# Patient Record
Sex: Female | Born: 1950 | Race: Black or African American | Hispanic: No | Smoking: Current some day smoker
Health system: Southern US, Community
[De-identification: ages and names within clinical notes are randomized; demographics above are authoritative.]

## PROBLEM LIST (undated history)

## (undated) DIAGNOSIS — R011 Cardiac murmur, unspecified: Secondary | ICD-10-CM

## (undated) DIAGNOSIS — I10 Essential (primary) hypertension: Secondary | ICD-10-CM

## (undated) DIAGNOSIS — R0600 Dyspnea, unspecified: Secondary | ICD-10-CM

## (undated) DIAGNOSIS — E119 Type 2 diabetes mellitus without complications: Secondary | ICD-10-CM

## (undated) DIAGNOSIS — E785 Hyperlipidemia, unspecified: Secondary | ICD-10-CM

## (undated) DIAGNOSIS — G629 Polyneuropathy, unspecified: Secondary | ICD-10-CM

## (undated) DIAGNOSIS — M1990 Unspecified osteoarthritis, unspecified site: Secondary | ICD-10-CM

## (undated) HISTORY — PX: BREAST SURGERY: SHX581

---

## 2011-11-21 ENCOUNTER — Other Ambulatory Visit (HOSPITAL_COMMUNITY): Payer: Self-pay | Admitting: Nurse Practitioner

## 2011-11-21 DIAGNOSIS — Z139 Encounter for screening, unspecified: Secondary | ICD-10-CM

## 2011-12-02 ENCOUNTER — Ambulatory Visit (HOSPITAL_COMMUNITY): Payer: Self-pay

## 2015-10-06 ENCOUNTER — Emergency Department
Admission: EM | Admit: 2015-10-06 | Discharge: 2015-10-06 | Disposition: A | Discharge status: Home / Self Care | Payer: Medicare HMO | Attending: Emergency Medicine | Admitting: Emergency Medicine

## 2015-10-06 ENCOUNTER — Encounter: Payer: Self-pay | Admitting: Emergency Medicine

## 2015-10-06 ENCOUNTER — Emergency Department: Payer: Medicare HMO

## 2015-10-06 DIAGNOSIS — Y9289 Other specified places as the place of occurrence of the external cause: Secondary | ICD-10-CM | POA: Insufficient documentation

## 2015-10-06 DIAGNOSIS — W01198A Fall on same level from slipping, tripping and stumbling with subsequent striking against other object, initial encounter: Secondary | ICD-10-CM | POA: Insufficient documentation

## 2015-10-06 DIAGNOSIS — E119 Type 2 diabetes mellitus without complications: Secondary | ICD-10-CM | POA: Diagnosis not present

## 2015-10-06 DIAGNOSIS — Y9301 Activity, walking, marching and hiking: Secondary | ICD-10-CM | POA: Insufficient documentation

## 2015-10-06 DIAGNOSIS — S8002XA Contusion of left knee, initial encounter: Secondary | ICD-10-CM | POA: Diagnosis not present

## 2015-10-06 DIAGNOSIS — I10 Essential (primary) hypertension: Secondary | ICD-10-CM | POA: Diagnosis not present

## 2015-10-06 DIAGNOSIS — Y999 Unspecified external cause status: Secondary | ICD-10-CM | POA: Diagnosis not present

## 2015-10-06 DIAGNOSIS — M1712 Unilateral primary osteoarthritis, left knee: Secondary | ICD-10-CM | POA: Diagnosis not present

## 2015-10-06 DIAGNOSIS — F17210 Nicotine dependence, cigarettes, uncomplicated: Secondary | ICD-10-CM | POA: Insufficient documentation

## 2015-10-06 DIAGNOSIS — S0181XA Laceration without foreign body of other part of head, initial encounter: Secondary | ICD-10-CM | POA: Insufficient documentation

## 2015-10-06 HISTORY — DX: Essential (primary) hypertension: I10

## 2015-10-06 HISTORY — DX: Type 2 diabetes mellitus without complications: E11.9

## 2015-10-06 NOTE — ED Provider Notes (Signed)
[facility] Emergency Department Provider Note   ____________________________________________   First MD Initiated Contact with Patient 10/06/15 [phone]     (approximate)  I have reviewed the triage vital signs and the nursing notes.   HISTORY  Chief Complaint Fall and Head Laceration    HPI Sarah Wiley is a 65 y.o. female patient complain of forehead pain and laceration secondary to a fall. Patient also complaining of left knee pain secondary to a fall. Patient states she was walking her left knee gave out causing her to land on her knee and didn't hit her head on the concrete. Patient denies any LOC. Incident occurred approximately one half hours prior to arrival. Patient has a history of bilateral knee pain which she stated unusual for the knee give out on her. Patient put initially days after fall with meals okay now. Patient denies any vertigo or vision disturbance. Patient rates his overall pain as a 3/10. Patient describes the pain as "achy". No palliative measures prior to arrival.  Past Medical History:  Diagnosis Date  . Diabetes mellitus without complication (HCC)   . Hypertension     There are no active problems to display for this patient.   History reviewed. No pertinent surgical history.  Prior to Admission medications   Not on File    Allergies Review of patient's allergies indicates no known allergies.  History reviewed. No pertinent family history.  Social History Social History  Substance Use Topics  . Smoking status: Current Every Day Smoker    Types: Cigarettes  . Smokeless tobacco: Never Used  . Alcohol use No    Review of Systems Constitutional: No fever/chills Eyes: No visual changes. ENT: No sore throat. Cardiovascular: Denies chest pain. Respiratory: Denies shortness of breath. Gastrointestinal: No abdominal pain.  No nausea, no vomiting.  No diarrhea.  No constipation. Genitourinary: Negative for  dysuria. Musculoskeletal: Left knee pain  Skin: Negative for rash. Forehead laceration Neurological: Negative for headaches, focal weakness or numbness. Endocrine:Diabetes /hypertension ____________________________________________   PHYSICAL EXAM:  VITAL SIGNS: ED Triage Vitals [10/06/15 2009]  Enc Vitals Group     BP (!) 129/57     Pulse Rate 68     Resp 18     Temp 98.4 F (36.9 C)     Temp Source Oral     SpO2 97 %     Weight 270 lb (122.5 kg)     Height 5\' 9"  (1.753 m)     Head Circumference      Peak Flow      Pain Score 0     Pain Loc      Pain Edu?      Excl. in GC?    Constitutional: Alert and oriented. Well appearing and in no acute distress. Eyes: Conjunctivae are normal. PERRL. EOMI. Head: Atraumatic. Nose: No congestion/rhinnorhea. Mouth/Throat: Mucous membranes are moist.  Oropharynx non-erythematous. Neck: No stridor.  No cervical spine tenderness to palpation. Hematological/Lymphatic/Immunilogical: No cervical lymphadenopathy. Cardiovascular: Normal rate, regular rhythm. Grossly normal heart sounds.  Good peripheral circulation. Respiratory: Normal respiratory effort.  No retractions. Lungs CTAB. Gastrointestinal: Soft and nontender. No distention. No abdominal bruits. No CVA tenderness. Musculoskeletal: No lower extremity tenderness nor edema.  No joint effusions. Neurologic:  Normal speech and language. No gross focal neurologic deficits are appreciated. No gait instability. Skin:  Skin is warm, dry and intact. No rash noted. 1.5 horizontal laceration to the forehead Psychiatric: Mood and affect are normal. Speech and behavior are  normal.  ____________________________________________   LABS (all labs ordered are listed, but only abnormal results are displayed)  Labs Reviewed - No data to  display ____________________________________________  EKG   ____________________________________________  RADIOLOGY   ____________________________________________   PROCEDURES  Procedure(s) performed: LACERATION REPAIR Performed by: Joni Reining Authorized by: Joni Reining Consent: Verbal consent obtained. Risks and benefits: risks, benefits and alternatives were discussed Consent given by: patient Patient identity confirmed: provided demographic data Prepped and Draped in normal sterile fashion Wound explored  Laceration Location: Forehead Laceration Length: 1.5cm No Foreign Bodies seen or palpated Irrigation method: syringe Amount of cleaning: standard Skin closure: Dermabond Patient tolerance: Patient tolerated the procedure well with no immediate complications.   Procedures  Critical Care performed: No  ____________________________________________   INITIAL IMPRESSION / ASSESSMENT AND PLAN / ED COURSE  Pertinent labs & imaging results that were available during my care of the patient were reviewed by me and considered in my medical decision making (see chart for details).  Forehead contusion and laceration. Left knee contusion. Patient given discharge instruction for care Dermabond closure. Patient advised follow-up treatment orthopedics secondary to severe arthritis left knee.  Clinical Course     ____________________________________________   FINAL CLINICAL IMPRESSION(S) / ED DIAGNOSES  Final diagnoses:  Forehead laceration, initial encounter  Contusion of left knee, initial encounter  Primary osteoarthritis of left knee      NEW MEDICATIONS STARTED DURING THIS VISIT:  New Prescriptions   No medications on file     Note:  This document was prepared using Dragon voice recognition software and may include unintentional dictation errors.    Joni Reining, PA-C 10/06/15 2244    Rockne Menghini, MD 10/07/15 [phone]

## 2015-10-06 NOTE — ED Triage Notes (Addendum)
Pt to triage via WC, reports was walking when left knee gave out causing fall and hit head on concrete, denies LOC.  Fall occurred about 1hr ago.  Pt reports problems with knee in past, but unusual for it to give out.  Pt reports dizziness after fall but okay now. Laceration to forehead, about 1" in length, bleeding controlled.  Pt NAD at this time, resp equal and unlabored skin warm and dry.

## 2015-10-06 NOTE — Discharge Instructions (Signed)
Follow up with treating Ortho Doctor for knee pain.

## 2015-10-06 NOTE — ED Notes (Signed)
Pt tripped outside at approx 1850 and hit head on concrete. Pt denies LOC, however family reports they think she lost consciousness. Pt was slightly disoriented when family found her. Pt fell, hit left knee. Pt has swelling to knee, however reports swelling is normal. Pt has bilateral chronic knee pain - reports normal amount of knee pain  Pt has approx 1' laceration to head.

## 2017-01-17 IMAGING — CR DG KNEE COMPLETE 4+V*L*
4 series · 4 of 4 positions shown · non-contrast
Comparison: None.

CLINICAL DATA: Status post fall, with injury to the left knee.
Initial encounter.

EXAM:
LEFT KNEE - COMPLETE 4+ VIEW

[knee obl (1 of 2)]
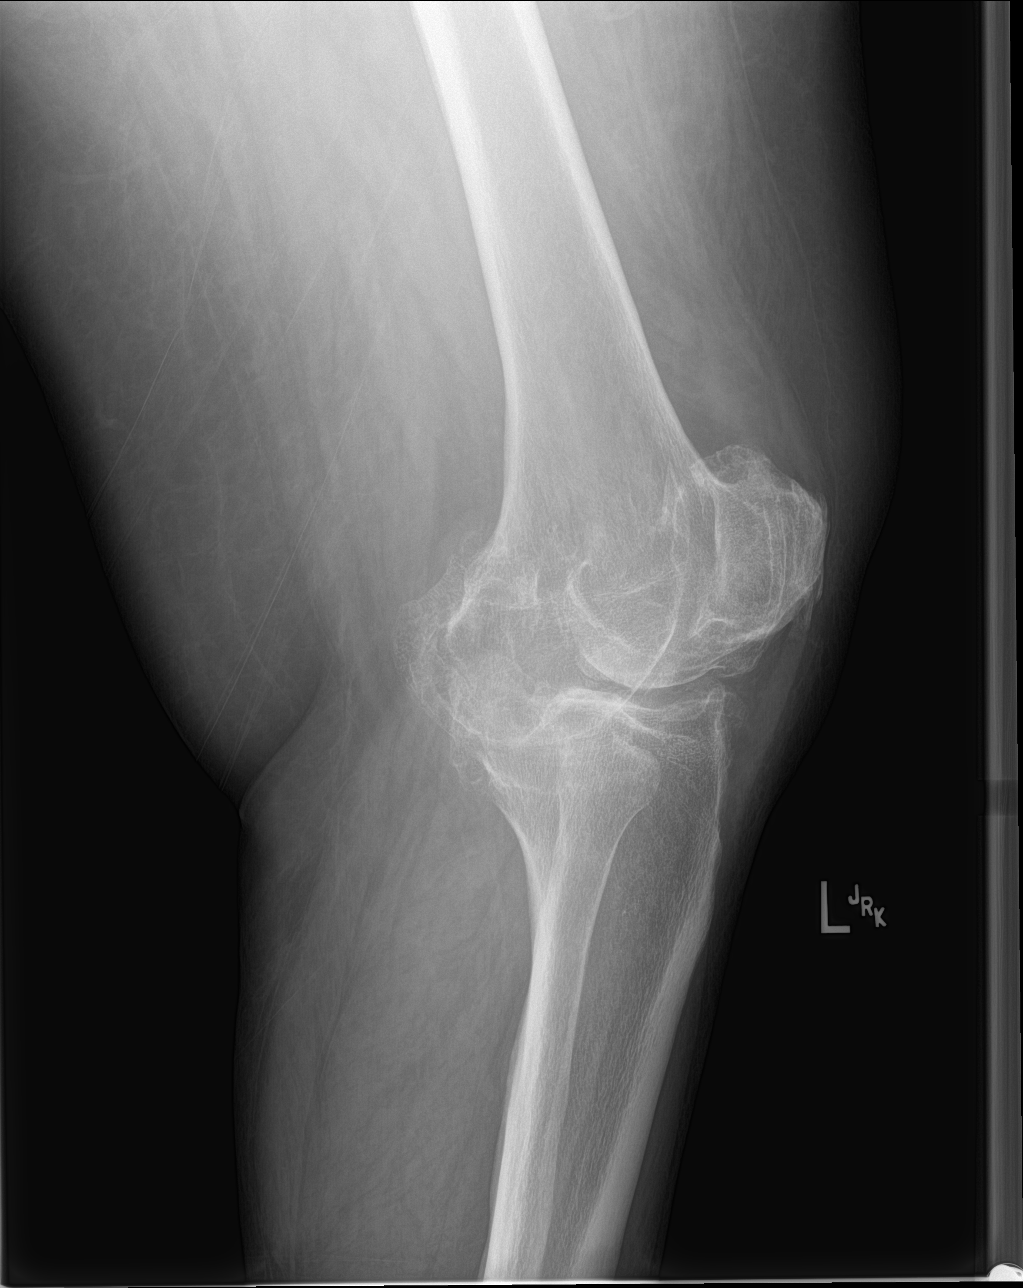

[knee obl (2 of 2)]
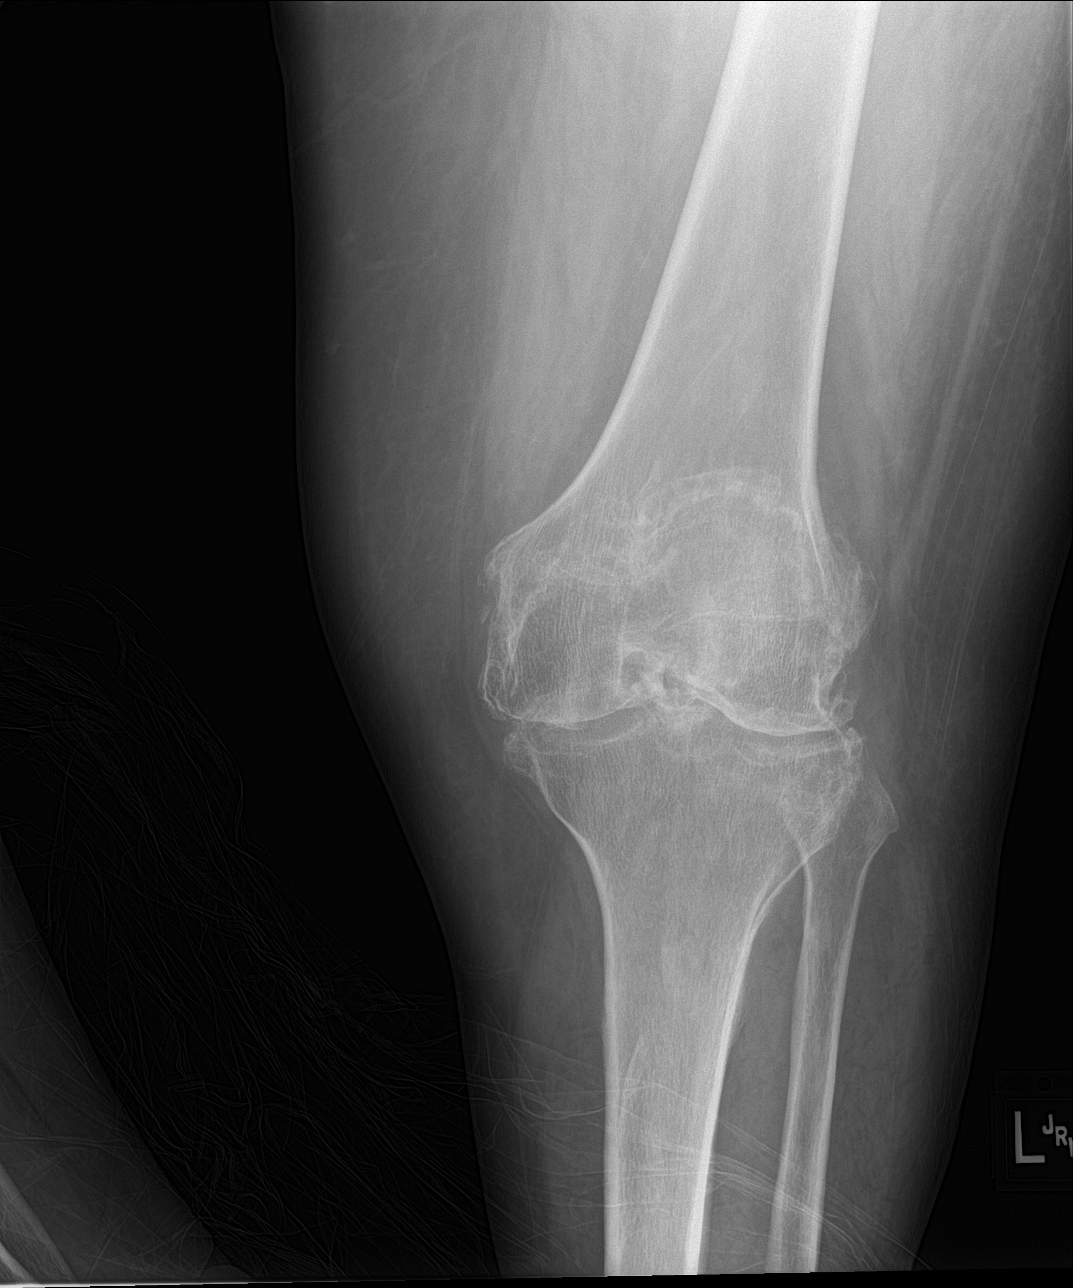

[knee lat]
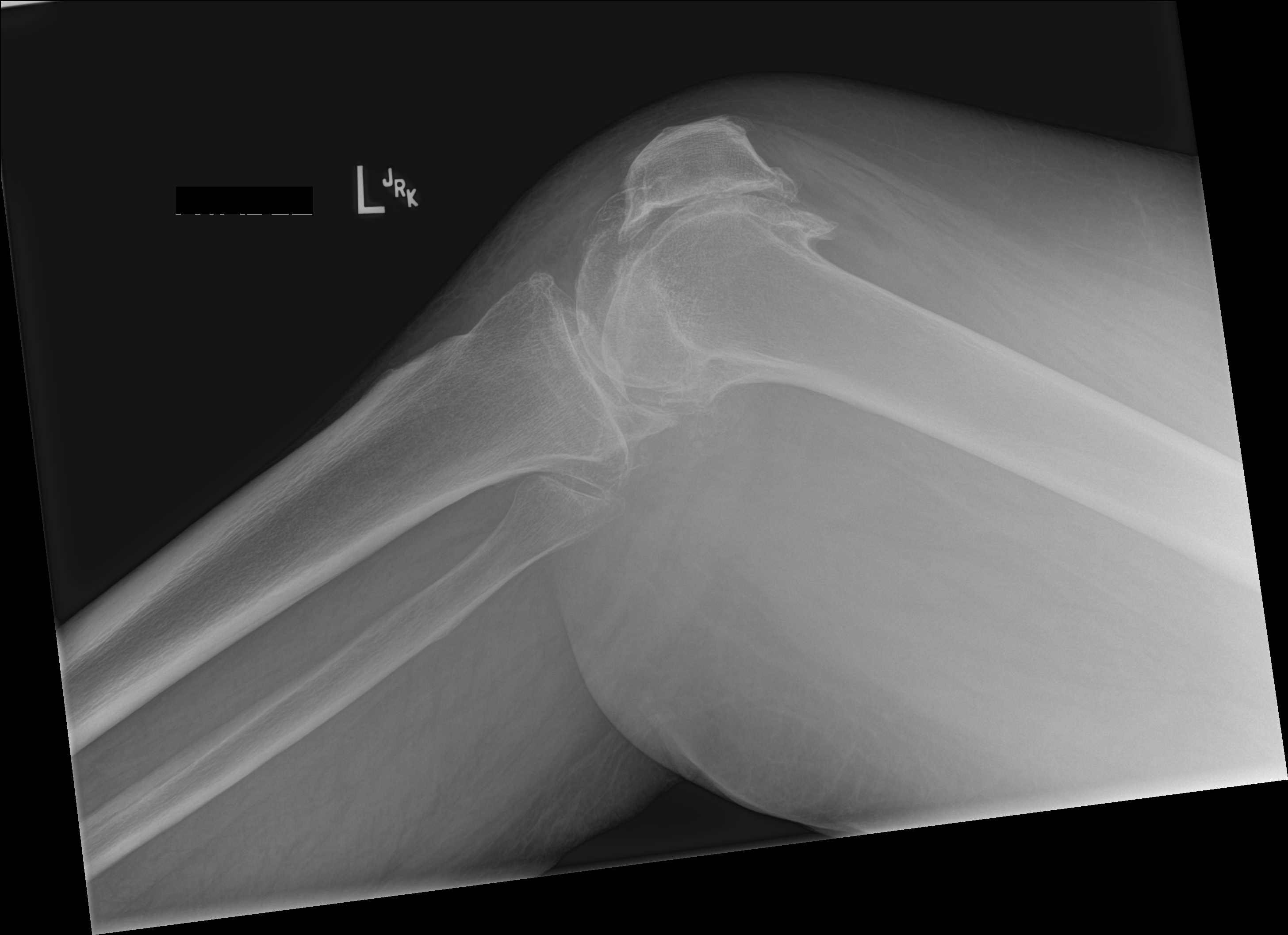

[knee ap]
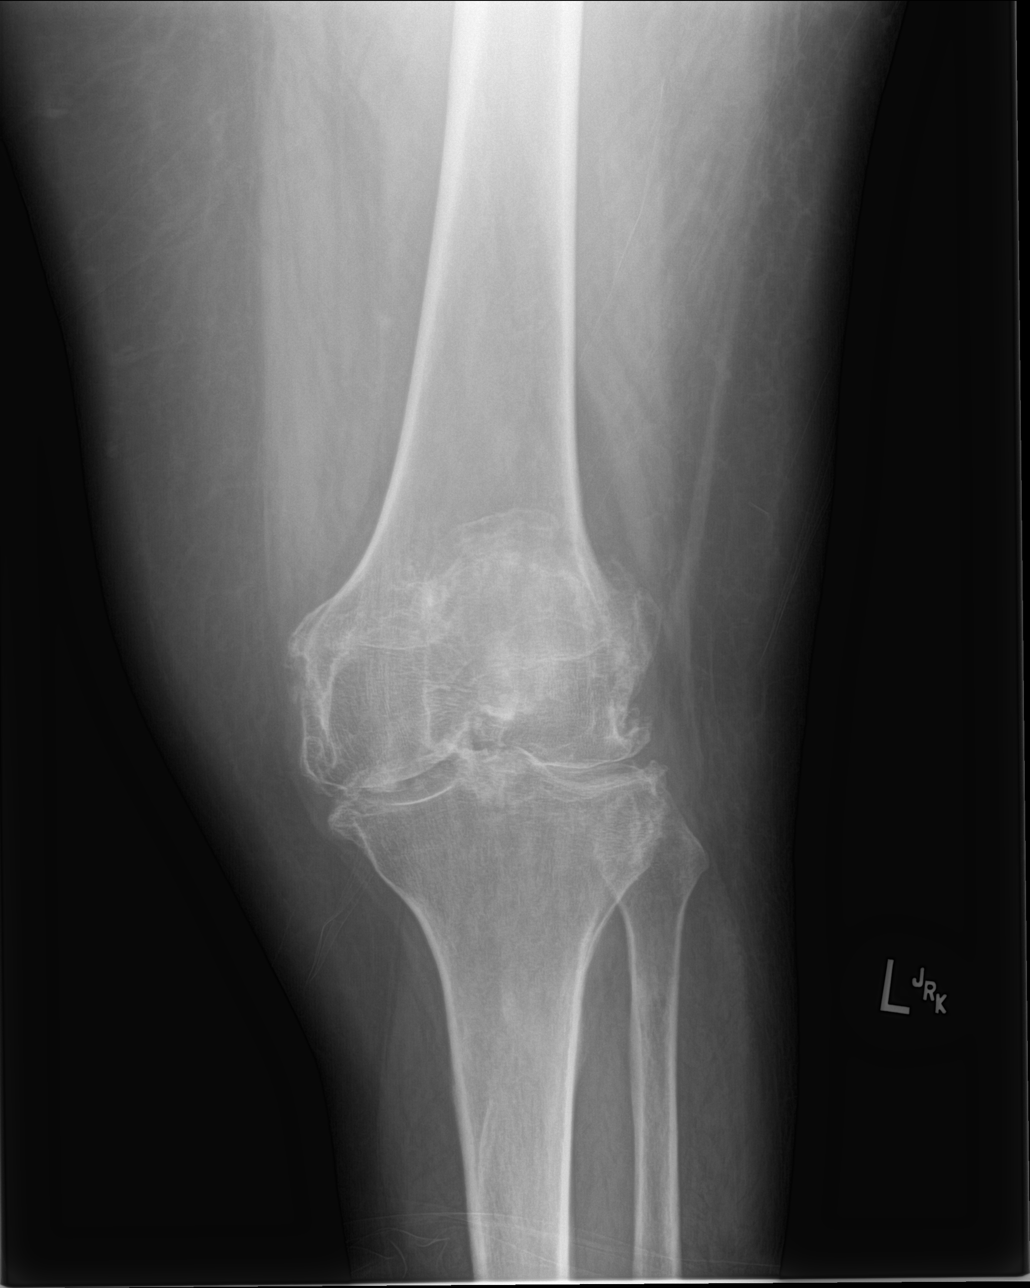

[4 of 4 positions shown; findings below may reference images not displayed]

FINDINGS: There is no evidence of fracture or dislocation. Mild
tricompartmental joint space narrowing is noted, with marginal
osteophytes, and diffuse mild cortical irregularity. Tibial spine
and wall osteophytes are seen.

No significant joint effusion is seen. The visualized soft tissues
are normal in appearance.
IMPRESSION: 1. No evidence of fracture or dislocation.
2. Tricompartmental osteoarthritis noted.

## 2017-01-17 IMAGING — CR DG SKULL COMPLETE 4+V
4 series · 4 of 4 positions shown · non-contrast
Comparison: None.

CLINICAL DATA: Status post fall, hitting right anterior head
against concrete. Initial encounter.

EXAM:
SKULL - COMPLETE 4 + VIEW

[skull lat (1 of 2)]
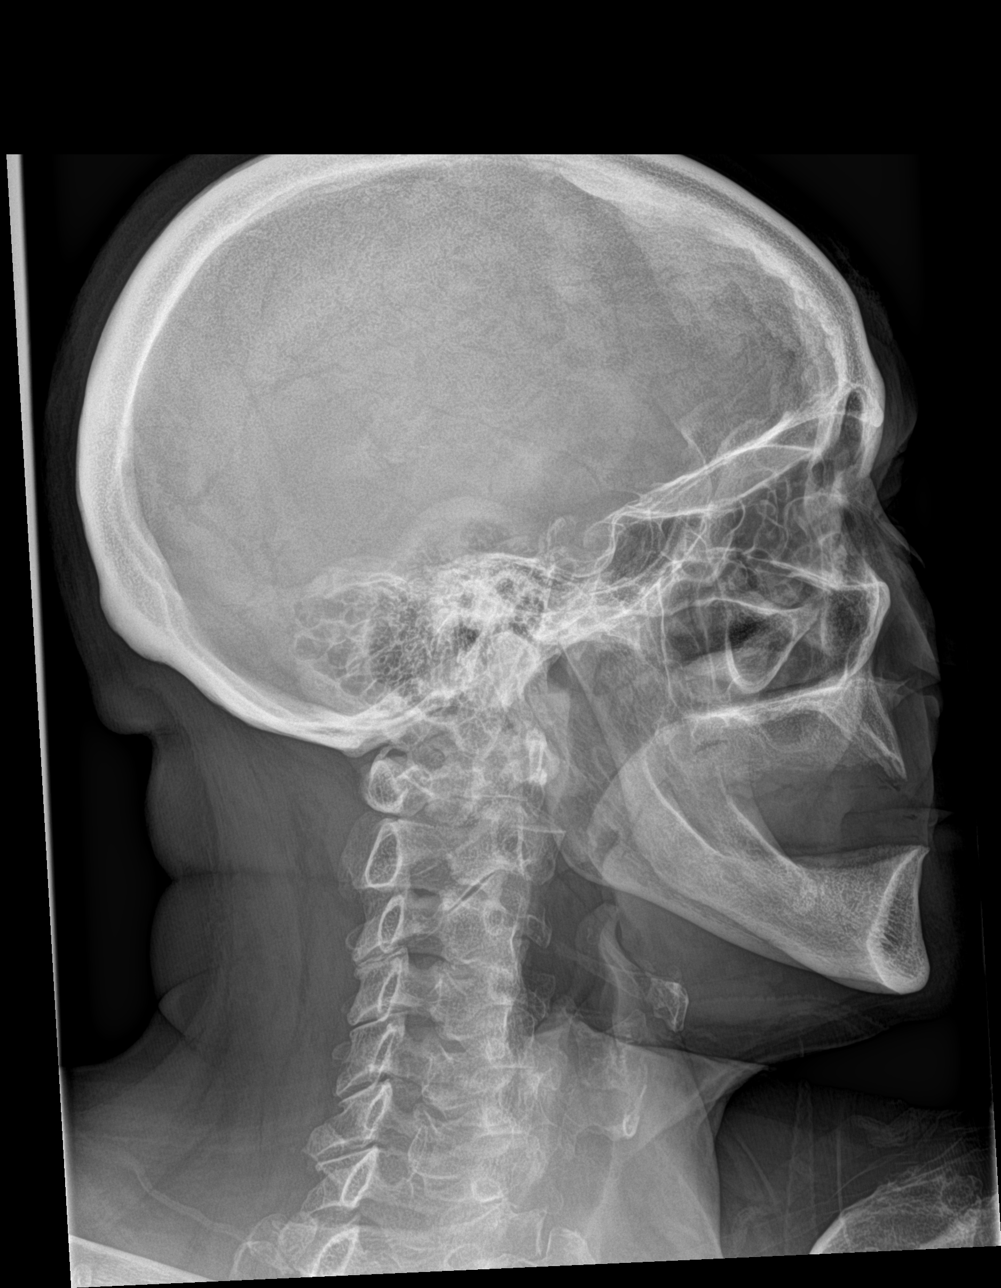

[skull pa]
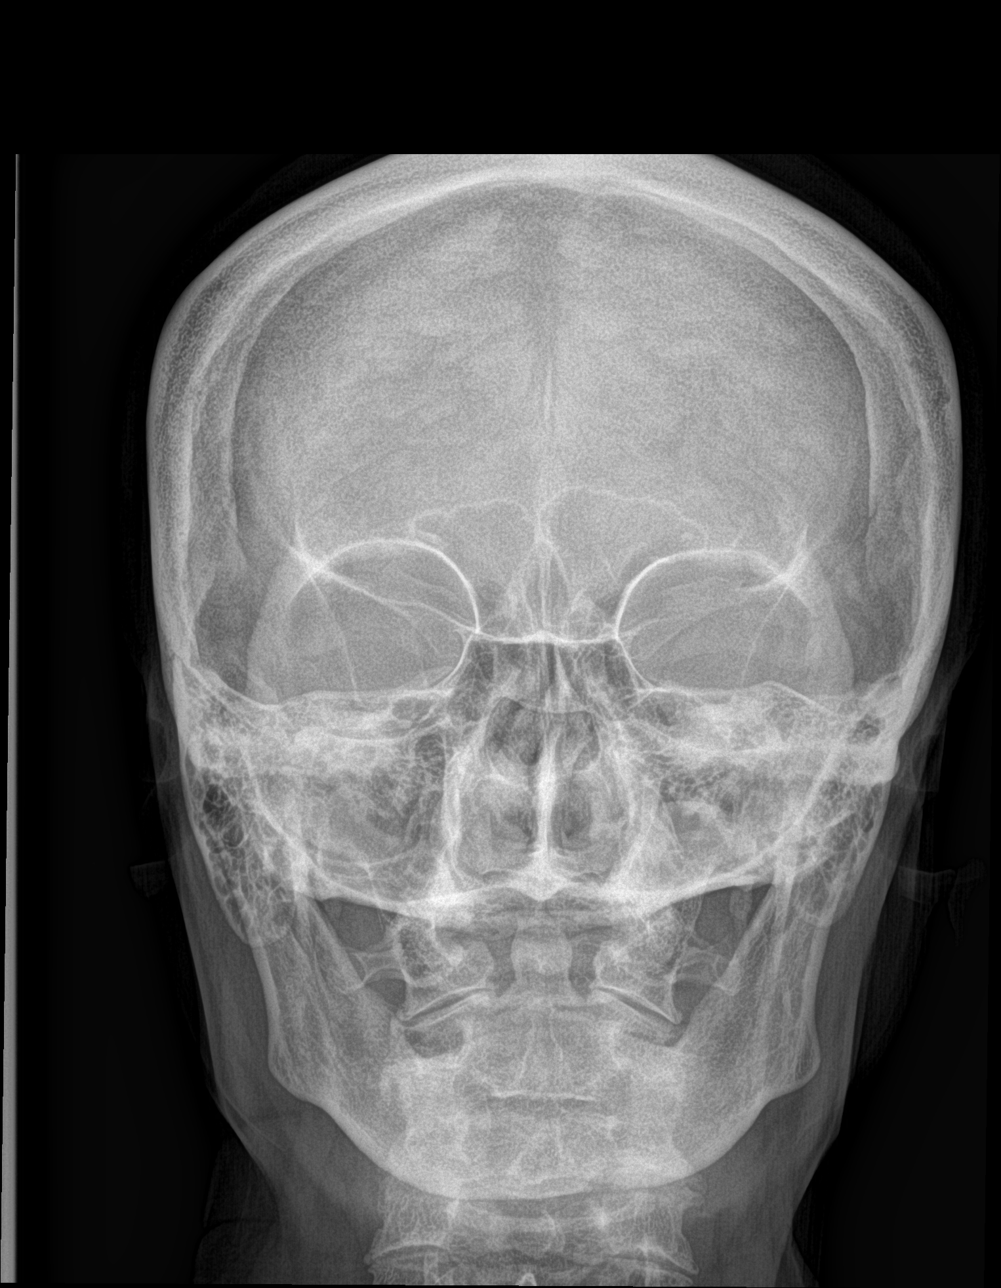

[skull towns]
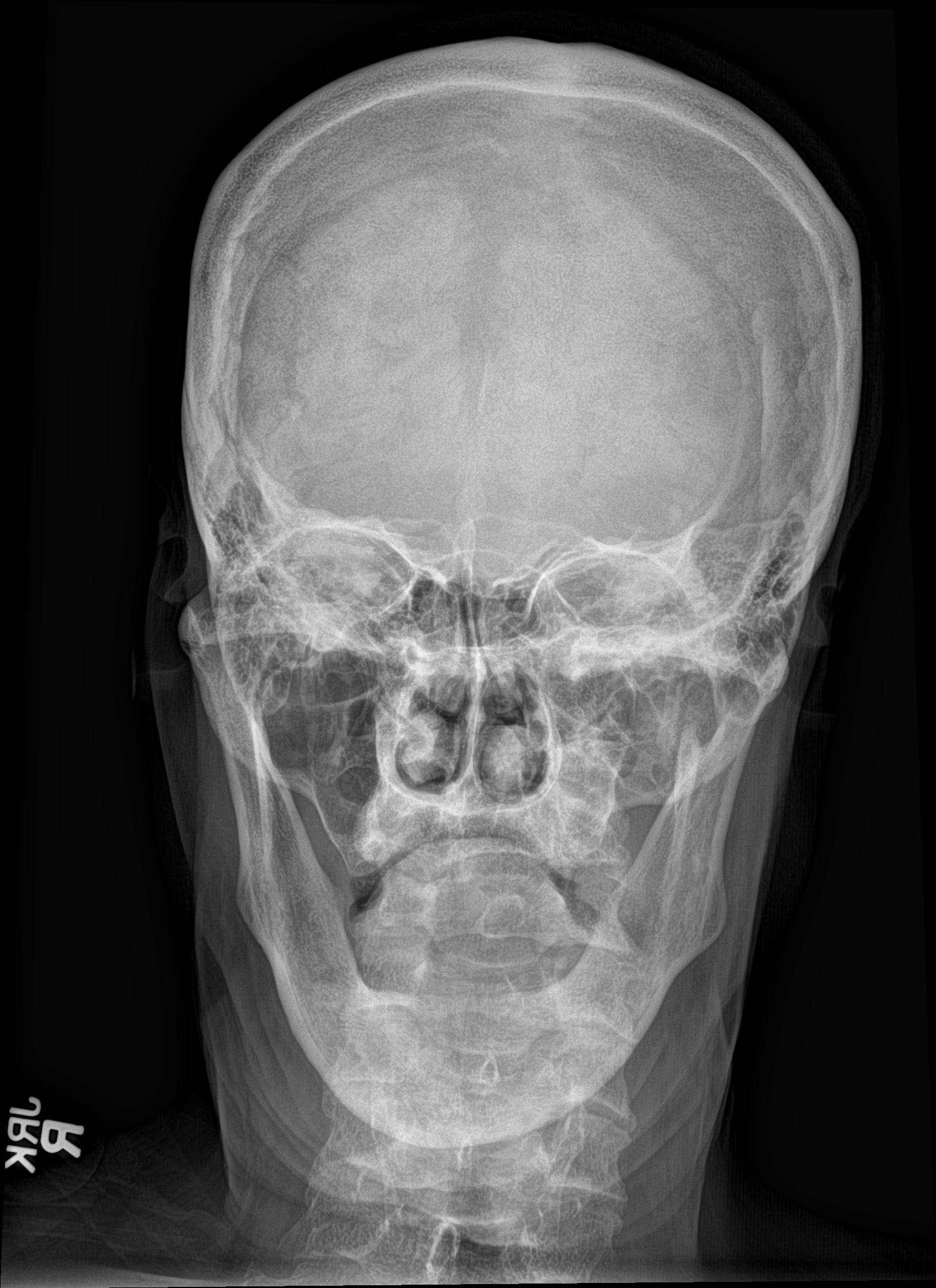

[skull lat (2 of 2)]
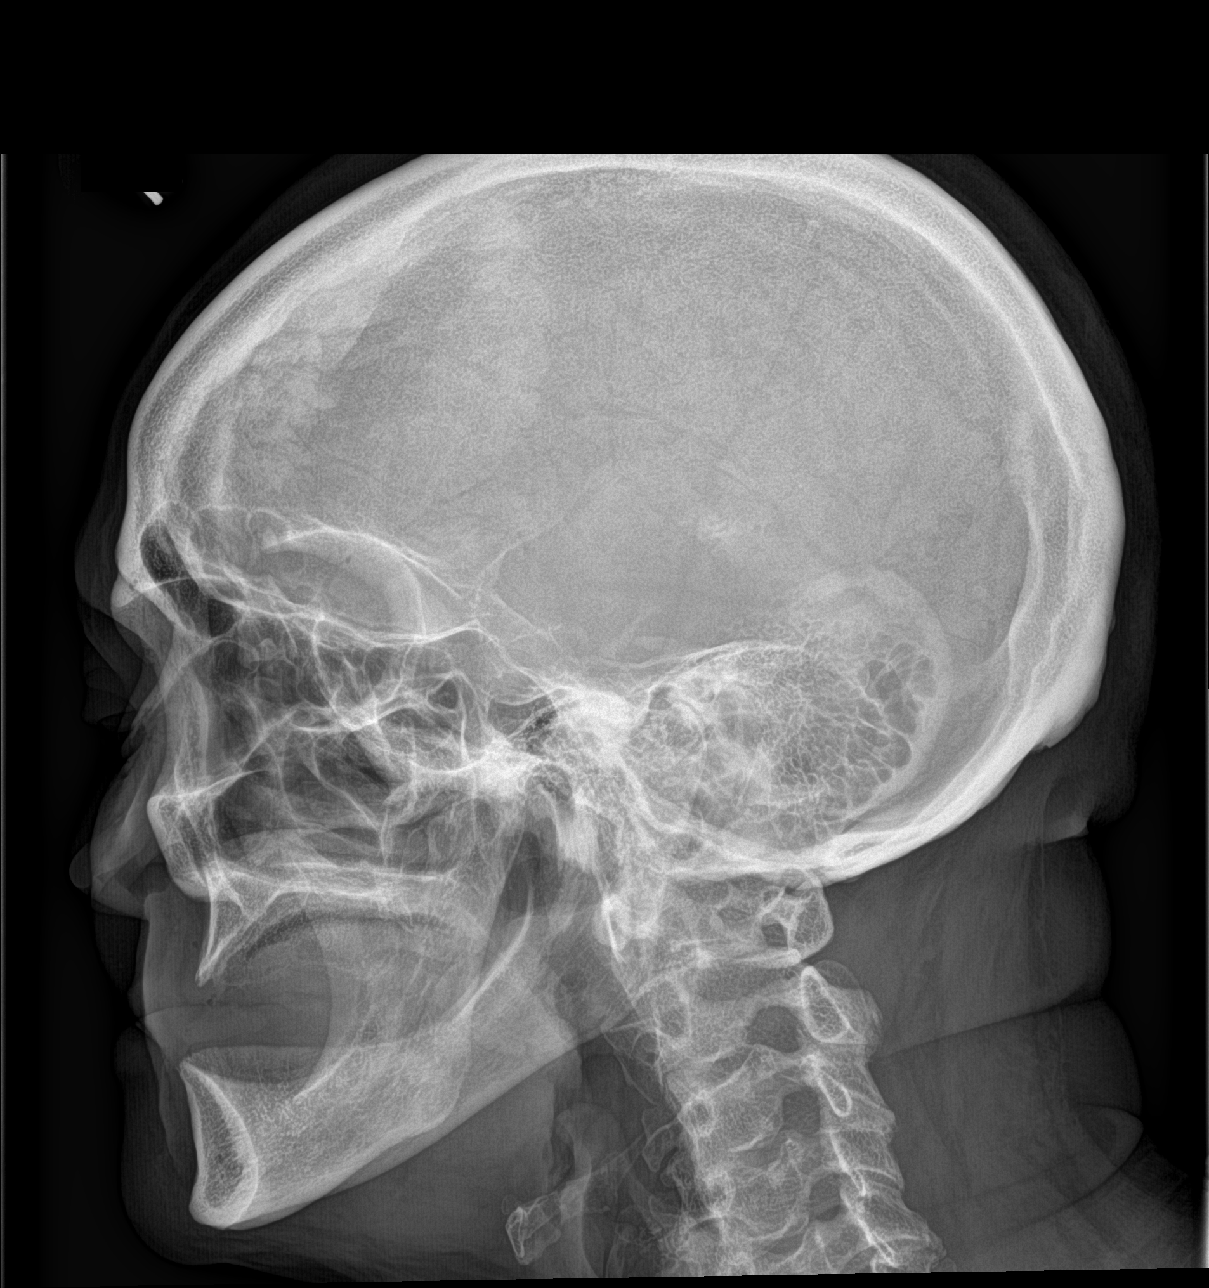

[4 of 4 positions shown; findings below may reference images not displayed]

FINDINGS: There is no evidence of fracture or dislocation. The calvarium
appears intact. The bony orbits are grossly unremarkable. The
paranasal sinuses and mastoid air cells are well-aerated. There is
chronic complete absence of the dentition.
IMPRESSION: No evidence of fracture or dislocation.

## 2017-02-11 ENCOUNTER — Encounter: Payer: Self-pay | Admitting: *Deleted

## 2017-02-17 ENCOUNTER — Encounter: Payer: Self-pay | Admitting: *Deleted

## 2017-02-17 ENCOUNTER — Ambulatory Visit
Admission: RE | Admit: 2017-02-17 | Discharge: 2017-02-17 | Disposition: A | Discharge status: Home / Self Care | Payer: Medicare HMO | Source: Ambulatory Visit | Attending: Ophthalmology | Admitting: Ophthalmology

## 2017-02-17 ENCOUNTER — Ambulatory Visit: Payer: Medicare HMO | Admitting: Registered Nurse

## 2017-02-17 ENCOUNTER — Encounter
Admission: RE | Disposition: A | Discharge status: Home / Self Care | Payer: Self-pay | Source: Ambulatory Visit | Attending: Ophthalmology

## 2017-02-17 ENCOUNTER — Other Ambulatory Visit: Payer: Self-pay

## 2017-02-17 DIAGNOSIS — F17200 Nicotine dependence, unspecified, uncomplicated: Secondary | ICD-10-CM | POA: Insufficient documentation

## 2017-02-17 DIAGNOSIS — E7800 Pure hypercholesterolemia, unspecified: Secondary | ICD-10-CM | POA: Diagnosis not present

## 2017-02-17 DIAGNOSIS — R011 Cardiac murmur, unspecified: Secondary | ICD-10-CM | POA: Insufficient documentation

## 2017-02-17 DIAGNOSIS — E1140 Type 2 diabetes mellitus with diabetic neuropathy, unspecified: Secondary | ICD-10-CM | POA: Diagnosis not present

## 2017-02-17 DIAGNOSIS — I10 Essential (primary) hypertension: Secondary | ICD-10-CM | POA: Diagnosis not present

## 2017-02-17 DIAGNOSIS — H2511 Age-related nuclear cataract, right eye: Secondary | ICD-10-CM | POA: Diagnosis present

## 2017-02-17 DIAGNOSIS — E1136 Type 2 diabetes mellitus with diabetic cataract: Secondary | ICD-10-CM | POA: Insufficient documentation

## 2017-02-17 DIAGNOSIS — F17290 Nicotine dependence, other tobacco product, uncomplicated: Secondary | ICD-10-CM | POA: Diagnosis not present

## 2017-02-17 DIAGNOSIS — M170 Bilateral primary osteoarthritis of knee: Secondary | ICD-10-CM | POA: Insufficient documentation

## 2017-02-17 HISTORY — PX: CATARACT EXTRACTION W/PHACO: SHX586

## 2017-02-17 HISTORY — DX: Unspecified osteoarthritis, unspecified site: M19.90

## 2017-02-17 HISTORY — DX: Dyspnea, unspecified: R06.00

## 2017-02-17 HISTORY — DX: Polyneuropathy, unspecified: G62.9

## 2017-02-17 HISTORY — DX: Cardiac murmur, unspecified: R01.1

## 2017-02-17 LAB — GLUCOSE, CAPILLARY: Glucose-Capillary: 75 mg/dL (ref 65–99)

## 2017-02-17 SURGERY — PHACOEMULSIFICATION, CATARACT, WITH IOL INSERTION
Anesthesia: Monitor Anesthesia Care | Site: Eye | Laterality: Right | Wound class: Clean

## 2017-02-17 MED ORDER — MOXIFLOXACIN HCL 0.5 % OP SOLN
OPHTHALMIC | Status: DC | PRN
  Administered 2017-02-17: 0.2 mL via OPHTHALMIC

## 2017-02-17 MED ORDER — ARMC OPHTHALMIC DILATING DROPS
1.0000 "application " | OPHTHALMIC | Status: AC
  Administered 2017-02-17 (×3): 1 via OPHTHALMIC

## 2017-02-17 MED ORDER — CARBACHOL 0.01 % IO SOLN
INTRAOCULAR | Status: DC | PRN
Start: 2017-02-17 — End: 2017-02-17
  Administered 2017-02-17: 0.5 mL via INTRAOCULAR

## 2017-02-17 MED ORDER — ARMC OPHTHALMIC DILATING DROPS
OPHTHALMIC | Status: AC
  Administered 2017-02-17: 1 via OPHTHALMIC
  Filled 2017-02-17: qty 0.4

## 2017-02-17 MED ORDER — POVIDONE-IODINE 5 % OP SOLN
OPHTHALMIC | Status: DC | PRN
  Administered 2017-02-17: 1 via OPHTHALMIC

## 2017-02-17 MED ORDER — MOXIFLOXACIN HCL 0.5 % OP SOLN: 1.0000 [drp] | OPHTHALMIC | Status: DC | PRN

## 2017-02-17 MED ORDER — SODIUM CHLORIDE 0.9 % IV SOLN
INTRAVENOUS | Status: DC
  Administered 2017-02-17: 10:00:00 via INTRAVENOUS

## 2017-02-17 MED ORDER — MOXIFLOXACIN HCL 0.5 % OP SOLN
OPHTHALMIC | Status: AC
  Filled 2017-02-17: qty 3

## 2017-02-17 MED ORDER — LIDOCAINE HCL (PF) 4 % IJ SOLN
INTRAOCULAR | Status: DC | PRN
  Administered 2017-02-17: 4 mL via OPHTHALMIC

## 2017-02-17 MED ORDER — POVIDONE-IODINE 5 % OP SOLN
OPHTHALMIC | Status: AC
  Filled 2017-02-17: qty 30

## 2017-02-17 MED ORDER — CEFUROXIME OPHTHALMIC INJECTION 1 MG/0.1 ML
INJECTION | OPHTHALMIC | Status: DC | PRN
  Administered 2017-02-17: 1 mg via INTRACAMERAL

## 2017-02-17 MED ORDER — FENTANYL CITRATE (PF) 100 MCG/2ML IJ SOLN
INTRAMUSCULAR | Status: AC
  Filled 2017-02-17: qty 2

## 2017-02-17 MED ORDER — LIDOCAINE HCL (PF) 4 % IJ SOLN
INTRAMUSCULAR | Status: AC
  Filled 2017-02-17: qty 5

## 2017-02-17 MED ORDER — FENTANYL CITRATE (PF) 100 MCG/2ML IJ SOLN
INTRAMUSCULAR | Status: DC | PRN
  Administered 2017-02-17: 25 ug via INTRAVENOUS

## 2017-02-17 MED ORDER — ONDANSETRON HCL 4 MG/2ML IJ SOLN: 4.0000 mg | Freq: Once | INTRAMUSCULAR | Status: DC | PRN

## 2017-02-17 MED ORDER — MIDAZOLAM HCL 2 MG/2ML IJ SOLN
INTRAMUSCULAR | Status: DC | PRN
  Administered 2017-02-17: 1 mg via INTRAVENOUS

## 2017-02-17 MED ORDER — NA CHONDROIT SULF-NA HYALURON 40-17 MG/ML IO SOLN
INTRAOCULAR | Status: AC
  Filled 2017-02-17: qty 1

## 2017-02-17 MED ORDER — NA CHONDROIT SULF-NA HYALURON 40-17 MG/ML IO SOLN
INTRAOCULAR | Status: DC | PRN
  Administered 2017-02-17: 1 mL via INTRAOCULAR

## 2017-02-17 MED ORDER — BSS IO SOLN
INTRAOCULAR | Status: DC | PRN
  Administered 2017-02-17: 11:00:00 via OPHTHALMIC

## 2017-02-17 MED ORDER — MIDAZOLAM HCL 2 MG/2ML IJ SOLN
INTRAMUSCULAR | Status: AC
  Filled 2017-02-17: qty 2

## 2017-02-17 MED ORDER — EPINEPHRINE PF 1 MG/ML IJ SOLN
INTRAMUSCULAR | Status: AC
  Filled 2017-02-17: qty 2

## 2017-02-17 SURGICAL SUPPLY — 16 items
GLOVE BIO SURGEON STRL SZ8 (GLOVE) ×3 IMPLANT
GLOVE BIOGEL M 6.5 STRL (GLOVE) ×3 IMPLANT
GLOVE SURG LX 8.0 MICRO (GLOVE) ×2
GLOVE SURG LX STRL 8.0 MICRO (GLOVE) ×1 IMPLANT
GOWN STRL REUS W/ TWL LRG LVL3 (GOWN DISPOSABLE) ×2 IMPLANT
GOWN STRL REUS W/TWL LRG LVL3 (GOWN DISPOSABLE) ×4
LABEL CATARACT MEDS ST (LABEL) ×3 IMPLANT
LENS IOL TECNIS ITEC 21.0 (Intraocular Lens) ×3 IMPLANT
PACK CATARACT (MISCELLANEOUS) ×3 IMPLANT
PACK CATARACT BRASINGTON LX (MISCELLANEOUS) ×3 IMPLANT
PACK EYE AFTER SURG (MISCELLANEOUS) ×3 IMPLANT
SOL BSS BAG (MISCELLANEOUS) ×3
SOLUTION BSS BAG (MISCELLANEOUS) ×1 IMPLANT
SYR 5ML LL (SYRINGE) ×3 IMPLANT
WATER STERILE IRR 250ML POUR (IV SOLUTION) ×3 IMPLANT
WIPE NON LINTING 3.25X3.25 (MISCELLANEOUS) ×3 IMPLANT

## 2017-02-17 NOTE — Transfer of Care (Signed)
Immediate Anesthesia Transfer of Care Note  Patient: Sarah Wiley  Procedure(s) Performed: CATARACT EXTRACTION PHACO AND INTRAOCULAR LENS PLACEMENT (I[facility]) (Right Eye)  Patient Location: PACU  Anesthesia Type:MAC  Level of Consciousness: awake, alert  and oriented  Airway & Oxygen Therapy: Patient Spontanous Breathing  Post-op Assessment: Report given to RN and Post -op Vital signs reviewed and stable  Post vital signs: Reviewed and stable  Last Vitals:  Vitals:   02/17/17 0933 02/17/17 1118  BP: (!) 145/59 (!) 113/43  Pulse: (!) 52 (!) 49  Resp: 18 14  Temp: 36.6 C 36.6 C  SpO2: 100% 100%    Last Pain:  Vitals:   02/17/17 1118  TempSrc: Oral         Complications: No apparent anesthesia complications

## 2017-02-17 NOTE — Anesthesia Postprocedure Evaluation (Signed)
Anesthesia Post Note  Patient: Sarah Wiley  Procedure(s) Performed: CATARACT EXTRACTION PHACO AND INTRAOCULAR LENS PLACEMENT ([place]) (Right Eye)  Patient location during evaluation: PACU Anesthesia Type: MAC Level of consciousness: awake Pain management: pain level controlled Vital Signs Assessment: post-procedure vital signs reviewed and stable Respiratory status: spontaneous breathing Cardiovascular status: blood pressure returned to baseline Postop Assessment: no apparent nausea or vomiting Anesthetic complications: no     Last Vitals:  Vitals:   02/17/17 0933 02/17/17 1118  BP: (!) 145/59 (!) 113/43  Pulse: (!) 52 (!) 49  Resp: 18 14  Temp: 36.6 C 36.6 C  SpO2: 100% 100%    Last Pain:  Vitals:   02/17/17 1118  TempSrc: Oral                 Malon Siddall Lorenza Chick

## 2017-02-17 NOTE — Op Note (Signed)
PREOPERATIVE DIAGNOSIS:  Nuclear sclerotic cataract of the right eye.   POSTOPERATIVE DIAGNOSIS:  NUCLEAR SCLEROTIC CATARACT RIGHT EYE   OPERATIVE PROCEDURE: Procedure(s): CATARACT EXTRACTION PHACO AND INTRAOCULAR LENS PLACEMENT ([facility])   SURGEON:  Galen ManilaWilliam Florabelle Cardin, MD.   ANESTHESIA:  Anesthesiologist: Naomie DeanKephart, Royer Cristobal K, MD CRNA: Karoline CaldwellStarr, Deana, CRNA  1.      Managed anesthesia care. 2.      0.21ml of Shugarcaine was instilled in the eye following the paracentesis.   COMPLICATIONS:  None.   TECHNIQUE:   Stop and chop   DESCRIPTION OF PROCEDURE:  The patient was examined and consented in the preoperative holding area where the aforementioned topical anesthesia was applied to the right eye and then brought back to the Operating Room where the right eye was prepped and draped in the usual sterile ophthalmic fashion and a lid speculum was placed. A paracentesis was created with the side port blade and the anterior chamber was filled with viscoelastic. A near clear corneal incision was performed with the steel keratome. A continuous curvilinear capsulorrhexis was performed with a cystotome followed by the capsulorrhexis forceps. Hydrodissection and hydrodelineation were carried out with BSS on a blunt cannula. The lens was removed in a stop and chop  technique and the remaining cortical material was removed with the irrigation-aspiration handpiece. The capsular bag was inflated with viscoelastic and the Technis ZCB00  lens was placed in the capsular bag without complication. The remaining viscoelastic was removed from the eye with the irrigation-aspiration handpiece. The wounds were hydrated. The anterior chamber was flushed with Miostat and the eye was inflated to physiologic pressure. 0.711ml of Vigamox was placed in the anterior chamber. The wounds were found to be water tight. The eye was dressed with Vigamox. The patient was given protective glasses to wear throughout the day and a shield with which  to sleep tonight. The patient was also given drops with which to begin a drop regimen today and will follow-up with me in one day. Implant Name Type Inv. Item Serial No. Manufacturer Lot No. LRB No. Used  LENS IOL DIOP 21.0 - [id number] Intraocular Lens LENS IOL DIOP 21.0 [id number] AMO  Right 1   Procedure(s) with comments: CATARACT EXTRACTION PHACO AND INTRAOCULAR LENS PLACEMENT ([facility]) (Right) - US 00:32.0 AP% 15.1 CDE 4.82 Fluid Pack Lot # [id number]  Electronically signed: Galen ManilaWilliam Taffie Eckmann 02/17/2017 11:17 AM

## 2017-02-17 NOTE — Discharge Instructions (Signed)
Eye Surgery Discharge Instructions  Expect mild scratchy sensation or mild soreness. DO NOT RUB YOUR EYE!  The day of surgery:  Minimal physical activity, but bed rest is not required  No reading, computer work, or close hand work  No bending, lifting, or straining.  May watch TV  For 24 hours:  No driving, legal decisions, or alcoholic beverages  Safety precautions  Eat anything you prefer: It is better to start with liquids, then soup then solid foods.  _____ Eye patch should be worn until postoperative exam tomorrow.  ____ Solar shield eyeglasses should be worn for comfort in the sunlight/patch while sleeping  Resume all regular medications including aspirin or Coumadin if these were discontinued prior to surgery. You may shower, bathe, shave, or wash your hair. Tylenol may be taken for mild discomfort.  Call your doctor if you experience significant pain, nausea, or vomiting, fever > 101 or other signs of infection. [phone] or [phone] Specific instructions:  Follow-up Information    Galen ManilaPorfilio, William, MD Follow up.   Specialty:  Ophthalmology Why:  January 23 at 9:50am Contact information: [address] [place] [address] [phone]

## 2017-02-17 NOTE — Anesthesia Post-op Follow-up Note (Signed)
Anesthesia QCDR form completed.        

## 2017-02-17 NOTE — H&P (Signed)
All labs reviewed. Abnormal studies sent to patients PCP when indicated.  Previous H&P reviewed, patient examined, there are NO CHANGES.  Sarah Perko Porfilio1/22/201910:52 AM

## 2017-02-17 NOTE — Anesthesia Preprocedure Evaluation (Signed)
Anesthesia Evaluation  Patient identified by MRN, date of birth, ID band Patient awake    Reviewed: Allergy & Precautions, NPO status , Patient's Chart, lab work & pertinent test results, reviewed documented beta blocker date and time   History of Anesthesia Complications Negative for: history of anesthetic complications  Airway Mallampati: II       Dental   Pulmonary neg sleep apnea, neg COPD, Current Smoker,           Cardiovascular hypertension, Pt. on medications and Pt. on home beta blockers (-) Past MI and (-) CHF (-) dysrhythmias + Valvular Problems/Murmurs (murmur)      Neuro/Psych neg Seizures    GI/Hepatic Neg liver ROS, neg GERD  ,  Endo/Other  diabetes, Type 2, Oral Hypoglycemic Agents  Renal/GU negative Renal ROS     Musculoskeletal   Abdominal   Peds  Hematology   Anesthesia Other Findings   Reproductive/Obstetrics                             Anesthesia Physical Anesthesia Plan  ASA: III  Anesthesia Plan: MAC   Post-op Pain Management:    Induction: Intravenous  PONV Risk Score and Plan:   Airway Management Planned: Nasal Cannula  Additional Equipment:   Intra-op Plan:   Post-operative Plan:   Informed Consent: I have reviewed the patients History and Physical, chart, labs and discussed the procedure including the risks, benefits and alternatives for the proposed anesthesia with the patient or authorized representative who has indicated his/her understanding and acceptance.     Plan Discussed with:   Anesthesia Plan Comments:         Anesthesia Quick Evaluation

## 2017-02-18 ENCOUNTER — Encounter: Payer: Self-pay | Admitting: Ophthalmology

## 2017-03-11 ENCOUNTER — Encounter: Payer: Self-pay | Admitting: *Deleted

## 2017-03-17 ENCOUNTER — Other Ambulatory Visit: Payer: Self-pay

## 2017-03-17 ENCOUNTER — Encounter
Admission: RE | Disposition: A | Discharge status: Home / Self Care | Payer: Self-pay | Source: Ambulatory Visit | Attending: Ophthalmology

## 2017-03-17 ENCOUNTER — Encounter: Payer: Self-pay | Admitting: *Deleted

## 2017-03-17 ENCOUNTER — Ambulatory Visit
Admission: RE | Admit: 2017-03-17 | Discharge: 2017-03-17 | Disposition: A | Discharge status: Home / Self Care | Payer: Medicare HMO | Source: Ambulatory Visit | Attending: Ophthalmology | Admitting: Ophthalmology

## 2017-03-17 ENCOUNTER — Ambulatory Visit: Payer: Medicare HMO | Admitting: Certified Registered Nurse Anesthetist

## 2017-03-17 DIAGNOSIS — Z79899 Other long term (current) drug therapy: Secondary | ICD-10-CM | POA: Diagnosis not present

## 2017-03-17 DIAGNOSIS — Z9841 Cataract extraction status, right eye: Secondary | ICD-10-CM | POA: Diagnosis not present

## 2017-03-17 DIAGNOSIS — Z7984 Long term (current) use of oral hypoglycemic drugs: Secondary | ICD-10-CM | POA: Diagnosis not present

## 2017-03-17 DIAGNOSIS — F17200 Nicotine dependence, unspecified, uncomplicated: Secondary | ICD-10-CM | POA: Insufficient documentation

## 2017-03-17 DIAGNOSIS — M170 Bilateral primary osteoarthritis of knee: Secondary | ICD-10-CM | POA: Insufficient documentation

## 2017-03-17 DIAGNOSIS — E1140 Type 2 diabetes mellitus with diabetic neuropathy, unspecified: Secondary | ICD-10-CM | POA: Diagnosis not present

## 2017-03-17 DIAGNOSIS — I10 Essential (primary) hypertension: Secondary | ICD-10-CM | POA: Diagnosis not present

## 2017-03-17 DIAGNOSIS — E7800 Pure hypercholesterolemia, unspecified: Secondary | ICD-10-CM | POA: Insufficient documentation

## 2017-03-17 DIAGNOSIS — E1136 Type 2 diabetes mellitus with diabetic cataract: Secondary | ICD-10-CM | POA: Diagnosis present

## 2017-03-17 DIAGNOSIS — H2512 Age-related nuclear cataract, left eye: Secondary | ICD-10-CM | POA: Insufficient documentation

## 2017-03-17 HISTORY — PX: CATARACT EXTRACTION W/PHACO: SHX586

## 2017-03-17 HISTORY — DX: Hyperlipidemia, unspecified: E78.5

## 2017-03-17 LAB — GLUCOSE, CAPILLARY: Glucose-Capillary: 86 mg/dL (ref 65–99)

## 2017-03-17 SURGERY — PHACOEMULSIFICATION, CATARACT, WITH IOL INSERTION
Anesthesia: General | Site: Eye | Laterality: Left | Wound class: Clean

## 2017-03-17 MED ORDER — FENTANYL CITRATE (PF) 100 MCG/2ML IJ SOLN
INTRAMUSCULAR | Status: DC | PRN
  Administered 2017-03-17: 50 ug via INTRAVENOUS

## 2017-03-17 MED ORDER — MOXIFLOXACIN HCL 0.5 % OP SOLN
OPHTHALMIC | Status: DC | PRN
Start: 2017-03-17 — End: 2017-03-17
  Administered 2017-03-17: 0.2 mL via OPHTHALMIC

## 2017-03-17 MED ORDER — BSS IO SOLN
INTRAOCULAR | Status: DC | PRN
  Administered 2017-03-17: 08:00:00 via OPHTHALMIC

## 2017-03-17 MED ORDER — ARMC OPHTHALMIC DILATING DROPS
OPHTHALMIC | Status: AC
  Administered 2017-03-17: 1 via OPHTHALMIC
  Filled 2017-03-17: qty 0.4

## 2017-03-17 MED ORDER — NA CHONDROIT SULF-NA HYALURON 40-17 MG/ML IO SOLN
INTRAOCULAR | Status: AC
  Filled 2017-03-17: qty 1

## 2017-03-17 MED ORDER — LIDOCAINE HCL (PF) 4 % IJ SOLN
INTRAOCULAR | Status: DC | PRN
  Administered 2017-03-17: 4 mL via OPHTHALMIC

## 2017-03-17 MED ORDER — NA CHONDROIT SULF-NA HYALURON 40-17 MG/ML IO SOLN
INTRAOCULAR | Status: DC | PRN
  Administered 2017-03-17: 1 mL via INTRAOCULAR

## 2017-03-17 MED ORDER — MOXIFLOXACIN HCL 0.5 % OP SOLN
OPHTHALMIC | Status: AC
  Filled 2017-03-17: qty 3

## 2017-03-17 MED ORDER — SODIUM CHLORIDE 0.9 % IV SOLN
INTRAVENOUS | Status: DC
  Administered 2017-03-17: 07:00:00 via INTRAVENOUS

## 2017-03-17 MED ORDER — POVIDONE-IODINE 5 % OP SOLN
OPHTHALMIC | Status: DC | PRN
  Administered 2017-03-17: 1 via OPHTHALMIC

## 2017-03-17 MED ORDER — MOXIFLOXACIN HCL 0.5 % OP SOLN: 1.0000 [drp] | OPHTHALMIC | Status: DC | PRN

## 2017-03-17 MED ORDER — FENTANYL CITRATE (PF) 100 MCG/2ML IJ SOLN
INTRAMUSCULAR | Status: AC
  Filled 2017-03-17: qty 2

## 2017-03-17 MED ORDER — LIDOCAINE HCL (PF) 4 % IJ SOLN
INTRAMUSCULAR | Status: AC
  Filled 2017-03-17: qty 5

## 2017-03-17 MED ORDER — CARBACHOL 0.01 % IO SOLN
INTRAOCULAR | Status: DC | PRN
  Administered 2017-03-17: 0.5 mL via INTRAOCULAR

## 2017-03-17 MED ORDER — ARMC OPHTHALMIC DILATING DROPS
1.0000 "application " | OPHTHALMIC | Status: AC
  Administered 2017-03-17 (×3): 1 via OPHTHALMIC

## 2017-03-17 MED ORDER — POVIDONE-IODINE 5 % OP SOLN
OPHTHALMIC | Status: AC
  Filled 2017-03-17: qty 30

## 2017-03-17 MED ORDER — EPINEPHRINE PF 1 MG/ML IJ SOLN
INTRAMUSCULAR | Status: AC
  Filled 2017-03-17: qty 2

## 2017-03-17 MED ORDER — BSS IO SOLN
INTRAOCULAR | Status: DC | PRN
  Administered 2017-03-17: 15 mL via INTRAOCULAR

## 2017-03-17 SURGICAL SUPPLY — 16 items

## 2017-03-17 NOTE — Anesthesia Procedure Notes (Signed)
Procedure Name: MAC Performed by: Demetrius Charity, CRNA Pre-anesthesia Checklist: Patient identified, Emergency Drugs available, Suction available, Patient being monitored and Timeout performed Oxygen Delivery Method: Nasal cannula

## 2017-03-17 NOTE — Anesthesia Post-op Follow-up Note (Signed)
Anesthesia QCDR form completed.        

## 2017-03-17 NOTE — Op Note (Signed)
PREOPERATIVE DIAGNOSIS:  Nuclear sclerotic cataract of the left eye.   POSTOPERATIVE DIAGNOSIS:  Nuclear sclerotic cataract of the left eye.   OPERATIVE PROCEDURE: Procedure(s): CATARACT EXTRACTION PHACO AND INTRAOCULAR LENS PLACEMENT ([facility])   SURGEON:  Galen ManilaWilliam Joesiah Lonon, MD.   ANESTHESIA:  Anesthesiologist: Christia ReadingHowell, Scott T, MD CRNA: Malva CoganBeane, Catherine, CRNA  1.      Managed anesthesia care. 2.     0.361ml of Shugarcaine was instilled following the paracentesis   COMPLICATIONS:  None.   TECHNIQUE:   Stop and chop   DESCRIPTION OF PROCEDURE:  The patient was examined and consented in the preoperative holding area where the aforementioned topical anesthesia was applied to the left eye and then brought back to the Operating Room where the left eye was prepped and draped in the usual sterile ophthalmic fashion and a lid speculum was placed. A paracentesis was created with the side port blade and the anterior chamber was filled with viscoelastic. A near clear corneal incision was performed with the steel keratome. A continuous curvilinear capsulorrhexis was performed with a cystotome followed by the capsulorrhexis forceps. Hydrodissection and hydrodelineation were carried out with BSS on a blunt cannula. The lens was removed in a stop and chop  technique and the remaining cortical material was removed with the irrigation-aspiration handpiece. The capsular bag was inflated with viscoelastic and the Technis ZCB00 lens was placed in the capsular bag without complication. The remaining viscoelastic was removed from the eye with the irrigation-aspiration handpiece. The wounds were hydrated. The anterior chamber was flushed with Miostat and the eye was inflated to physiologic pressure. 0.211ml Vigamox was placed in the anterior chamber. The wounds were found to be water tight. The eye was dressed with Vigamox. The patient was given protective glasses to wear throughout the day and a shield with which to sleep  tonight. The patient was also given drops with which to begin a drop regimen today and will follow-up with me in one day. Implant Name Type Inv. Item Serial No. Manufacturer Lot No. LRB No. Used  LENS IOL DIOP 21.5 - [id number] Intraocular Lens LENS IOL DIOP 21.5 [id number] AMO  Left 1    Procedure(s) with comments: CATARACT EXTRACTION PHACO AND INTRAOCULAR LENS PLACEMENT ([facility]) (Left) - US 00:39.2 AP% 13.5 CDE 5.29 Fluid Pack Lot # [id number]   Electronically signed: Galen ManilaWilliam Patrick Salemi 03/17/2017 8:15 AM

## 2017-03-17 NOTE — Transfer of Care (Signed)
Immediate Anesthesia Transfer of Care Note  Patient: Sarah Wiley  Procedure(s) Performed: CATARACT EXTRACTION PHACO AND INTRAOCULAR LENS PLACEMENT ([facility]) (Left Eye)  Patient Location: PACU  Anesthesia Type:General  Level of Consciousness: awake, alert  and oriented  Airway & Oxygen Therapy: Patient Spontanous Breathing  Post-op Assessment: Report given to RN and Post -op Vital signs reviewed and stable  Post vital signs: Reviewed and stable  Last Vitals:  Vitals:   03/11/17 1159 03/17/17 0630  BP: 131/73 (!) 150/63  Pulse: 68 64  Resp:  16  Temp:  (!) 36 C  SpO2:  100%    Last Pain:  Vitals:   03/17/17 0630  TempSrc: Temporal         Complications: No apparent anesthesia complications

## 2017-03-17 NOTE — Discharge Instructions (Signed)
Follow Dr. Gerome SamPorfilio's postop eye drop instruction sheet as reviewed.  Eye Surgery Discharge Instructions  Expect mild scratchy sensation or mild soreness. DO NOT RUB YOUR EYE!  The day of surgery:  Minimal physical activity, but bed rest is not required  No reading, computer work, or close hand work  No bending, lifting, or straining.  May watch TV  For 24 hours:  No driving, legal decisions, or alcoholic beverages  Safety precautions  Eat anything you prefer: It is better to start with liquids, then soup then solid foods.  _____ Eye patch should be worn until postoperative exam tomorrow.  ____ Solar shield eyeglasses should be worn for comfort in the sunlight/patch while sleeping  Resume all regular medications including aspirin or Coumadin if these were discontinued prior to surgery. You may shower, bathe, shave, or wash your hair. Tylenol may be taken for mild discomfort.  Call your doctor if you experience significant pain, nausea, or vomiting, fever > 101 or other signs of infection. [phone] or [phone] Specific instructions:  Follow-up Information    Galen ManilaPorfilio, William, MD Follow up.   Specialty:  Ophthalmology Why:  03/18/17 @ 11:00 AM Contact information: [address] [place] [address] [phone]

## 2017-03-17 NOTE — Anesthesia Postprocedure Evaluation (Signed)
Anesthesia Post Note  Patient: Sarah Wiley  Procedure(s) Performed: CATARACT EXTRACTION PHACO AND INTRAOCULAR LENS PLACEMENT ([facility]) (Left Eye)  Patient location during evaluation: PACU Anesthesia Type: MAC Level of consciousness: awake and alert and oriented Pain management: pain level controlled Vital Signs Assessment: post-procedure vital signs reviewed and stable Respiratory status: respiratory function stable Cardiovascular status: stable Anesthetic complications: no     Last Vitals:  Vitals:   03/11/17 1159 03/17/17 0630  BP: 131/73 (!) 150/63  Pulse: 68 64  Resp:  16  Temp:  (!) 36 C  SpO2:  100%    Last Pain:  Vitals:   03/17/17 0630  TempSrc: Temporal                 Blima Singer

## 2017-03-17 NOTE — H&P (Signed)
All labs reviewed. Abnormal studies sent to patients PCP when indicated.  Previous H&P reviewed, patient examined, there are NO CHANGES.  Sarah Creson Porfilio2/19/20197:48 AM

## 2017-03-17 NOTE — Anesthesia Preprocedure Evaluation (Signed)
Anesthesia Evaluation  Patient identified by MRN, date of birth, ID band Patient awake    Reviewed: Allergy & Precautions, H&P , NPO status , reviewed documented beta blocker date and time   Airway Mallampati: II       Dental no notable dental hx.    Pulmonary shortness of breath, Current Smoker,     + decreased breath sounds      Cardiovascular hypertension, Normal cardiovascular exam+ Valvular Problems/Murmurs      Neuro/Psych    GI/Hepatic   Endo/Other  diabetes  Renal/GU      Musculoskeletal  (+) Arthritis ,   Abdominal   Peds  Hematology   Anesthesia Other Findings   Reproductive/Obstetrics                             Anesthesia Physical Anesthesia Plan  ASA: III  Anesthesia Plan: General   Post-op Pain Management:    Induction:   PONV Risk Score and Plan: 3 and Propofol infusion  Airway Management Planned:   Additional Equipment:   Intra-op Plan:   Post-operative Plan:   Informed Consent: I have reviewed the patients History and Physical, chart, labs and discussed the procedure including the risks, benefits and alternatives for the proposed anesthesia with the patient or authorized representative who has indicated his/her understanding and acceptance.   Dental Advisory Given  Plan Discussed with:   Anesthesia Plan Comments:         Anesthesia Quick Evaluation

## 2017-04-07 ENCOUNTER — Encounter: Payer: Self-pay | Admitting: Obstetrics and Gynecology

## 2017-04-07 ENCOUNTER — Ambulatory Visit (INDEPENDENT_AMBULATORY_CARE_PROVIDER_SITE_OTHER): Payer: Medicare HMO | Admitting: Obstetrics and Gynecology

## 2017-04-07 VITALS — BP 146/78 | HR 88 | Ht 69.0 in | Wt 271.0 lb

## 2017-04-07 DIAGNOSIS — N950 Postmenopausal bleeding: Secondary | ICD-10-CM

## 2017-04-07 DIAGNOSIS — E7800 Pure hypercholesterolemia, unspecified: Secondary | ICD-10-CM | POA: Insufficient documentation

## 2017-04-07 DIAGNOSIS — I10 Essential (primary) hypertension: Secondary | ICD-10-CM | POA: Diagnosis not present

## 2017-04-07 NOTE — Progress Notes (Signed)
Patient ID: Sarah Wiley, female   DOB: [birth year], 67 y.o.   MRN: [id number]  Reason for Consult: Vaginal Bleeding (Vaginal spotting x2 months/no cramping)   Referred by Gusler, Christin Z, NP  Subjective:     HPI:  Sarah Wiley is a 67 y.o. female patient reports that she has had 4 months of vaginal spotting which started in December of 2018. She reports that this is the first time since menopause. She reports that she entered menopause in her 13s but is uncertain of the exact year. She is uncertain about when she entered menarche, but she reports that in the past she always had regular monthly menses. She denies a history of fibroids. She denies pelvic pain or pressure.  Denies early satiety. Denies weight changes.   Past Medical History:  Diagnosis Date  . Arthritis   . Diabetes mellitus without complication (HCC)   . Dyspnea    DOE  . Elevated lipids   . Heart murmur   . Hypertension   . Neuropathy    Family History  Problem Relation Age of Onset  . Pancreatic cancer Father   . Pancreatic cancer Brother    Patient reports that she does not have a family history or breast, uterine, or ovarian cancer.  Past Surgical History:  Procedure Laterality Date  . BREAST SURGERY     LUMPECTOMY  . CATARACT EXTRACTION W/PHACO Right 02/17/2017   Procedure: CATARACT EXTRACTION PHACO AND INTRAOCULAR LENS PLACEMENT (IOC);  Surgeon: Galen Manila, MD;  Location: [facility] ORS;  Service: Ophthalmology;  Laterality: Right;  [place] 00:32.0 AP% 15.1 CDE 4.82 Fluid Pack Lot # [id number]  . CATARACT EXTRACTION W/PHACO Left 03/17/2017   Procedure: CATARACT EXTRACTION PHACO AND INTRAOCULAR LENS PLACEMENT (IOC);  Surgeon: Galen Manila, MD;  Location: [facility] ORS;  Service: Ophthalmology;  Laterality: Left;  [place] 00:39.2 AP% 13.5 CDE 5.29 Fluid Pack Lot # [id number]   . CESAREAN SECTION      Short Social History:  Social History   Tobacco Use  . Smoking status: Current Some Day Smoker    Types:  Cigarettes  . Smokeless tobacco: Current User    Types: Snuff  Substance Use Topics  . Alcohol use: No    No Known Allergies  Current Outpatient Medications  Medication Sig Dispense Refill  . acetaminophen (TYLENOL) 500 MG tablet Take 1,000 mg by mouth every 6 (six) hours as needed for moderate pain or headache.    Marland Kitchen atorvastatin (LIPITOR) 20 MG tablet Take 20 mg by mouth daily at 6 PM.    . gabapentin (NEURONTIN) 300 MG capsule Take 900 mg by mouth 2 (two) times daily.     . metFORMIN (GLUCOPHAGE) 500 MG tablet Take 500 mg by mouth 2 (two) times daily with a meal.     . Metoprolol Succinate 25 MG CS24 Take 25 mg by mouth 2 (two) times daily.    . quinapril (ACCUPRIL) 40 MG tablet Take 80 mg by mouth at bedtime.      No current facility-administered medications for this visit.     REVIEW OF SYSTEMS      Objective:  Objective   Vitals:   04/07/17 1531  BP: (!) 146/78  Pulse: 88  Weight: 271 lb (122.9 kg)  Height: 5\' 9"  (1.753 m)   Body mass index is 40.02 kg/m.  Physical Exam  Constitutional: She is oriented to person, place, and time. She appears well-developed and well-nourished.  HENT:  Head: Normocephalic and atraumatic.  Cardiovascular: Normal rate  and regular rhythm.  Pulmonary/Chest: Effort normal.  Abdominal: Soft.  Genitourinary: Vagina normal and uterus normal.  Genitourinary Comments: Small dark red blood seen in vaginal vault. Endometrial biopsy performed. Crossly normal cervix.   Neurological: She is alert and oriented to person, place, and time.  Skin: Skin is warm and dry.    Endometrial Biopsy After discussion with the patient regarding her abnormal uterine bleeding I recommended that she proceed with an endometrial biopsy for further diagnosis. The risks, benefits, alternatives, and indications for an endometrial biopsy were discussed with the patient in detail. She understood the risks including infection, bleeding, cervical laceration and uterine  perforation.  Verbal consent was obtained.   PROCEDURE NOTE:  Pipelle endometrial biopsy was performed using aseptic technique with iodine preparation.  The uterus was sounded to a length of 6 cm. Scant sample obtained with minimal blood loss.  The patient tolerated the procedure well.  Disposition will be pending pathology.      Assessment/Plan:    1. Postmenopausal bleeding- discussed risk of endometrial cancer. Performed endometrial biopsy today in office, but discussed with patient that if she has continued bleeding and this initial specimen was negative she might need a hysterocopy D&C in the future to further evaluate for abnormal uterine bleeding.  Will follow up in 1 week and perform a transvaginal US at that time.   Natale Milchhristanna R Vanette Noguchi MD [facility], [facility] 04/21/17 9:58 PM

## 2017-04-13 LAB — PATHOLOGY

## 2017-04-21 ENCOUNTER — Encounter: Payer: Self-pay | Admitting: Obstetrics and Gynecology

## 2017-04-22 NOTE — Progress Notes (Signed)
Called home phone but busy tone with no answer. Patient has follow up scheduled for 04/24/17 in office, can discuss then.

## 2017-04-23 ENCOUNTER — Telehealth: Payer: Self-pay | Admitting: Obstetrics and Gynecology

## 2017-04-23 NOTE — Telephone Encounter (Signed)
I called and left voicemail for patient's Daughter to call back to reschedule appt per Patients request due to transportation issues

## 2017-04-23 NOTE — Telephone Encounter (Signed)
Patient has give verbal consent for patient schedule appt on 04/24/17 to Cassandra Graves to reschedule appt due to transportation issues.

## 2017-04-24 ENCOUNTER — Ambulatory Visit: Payer: Medicare HMO | Admitting: Obstetrics and Gynecology

## 2017-04-24 ENCOUNTER — Other Ambulatory Visit: Payer: Medicare HMO

## 2017-04-24 NOTE — Progress Notes (Signed)
Negative result, called and discussed with patient, follow up US planned.

## 2017-04-28 ENCOUNTER — Other Ambulatory Visit: Payer: Self-pay | Admitting: Obstetrics and Gynecology

## 2017-04-28 DIAGNOSIS — N950 Postmenopausal bleeding: Secondary | ICD-10-CM

## 2017-04-29 ENCOUNTER — Encounter: Payer: Self-pay | Admitting: Obstetrics and Gynecology

## 2017-04-29 ENCOUNTER — Ambulatory Visit (INDEPENDENT_AMBULATORY_CARE_PROVIDER_SITE_OTHER): Payer: Medicare HMO | Admitting: Obstetrics and Gynecology

## 2017-04-29 ENCOUNTER — Ambulatory Visit (INDEPENDENT_AMBULATORY_CARE_PROVIDER_SITE_OTHER): Payer: Medicare HMO

## 2017-04-29 VITALS — BP 150/80 | HR 52 | Ht 69.0 in | Wt 264.0 lb

## 2017-04-29 DIAGNOSIS — N950 Postmenopausal bleeding: Secondary | ICD-10-CM | POA: Diagnosis not present

## 2017-04-29 NOTE — Progress Notes (Signed)
   Discussed option of hysteroscopy d&c vs myomectomy, vs hysterectomy vs. Uterine artery embolization. Patient would like to move forward with hysteroscopy d&c with myosure.

## 2017-04-29 NOTE — Addendum Note (Signed)
Addendum  created 04/29/17 1047 by Christia ReadingHowell, Rital Cavey T, MD   Attestation recorded in Intraprocedure, Intraprocedure Attestations filed

## 2017-05-01 ENCOUNTER — Other Ambulatory Visit: Payer: Self-pay | Admitting: Internal Medicine

## 2017-05-01 DIAGNOSIS — R079 Chest pain, unspecified: Secondary | ICD-10-CM

## 2017-05-11 ENCOUNTER — Encounter: Admission: RE | Admit: 2017-05-11 | Payer: Medicare HMO | Source: Ambulatory Visit

## 2017-08-07 ENCOUNTER — Emergency Department
Admission: EM | Admit: 2017-08-07 | Discharge: 2017-08-27 | Disposition: E | Payer: Medicare HMO | Attending: Emergency Medicine | Admitting: Emergency Medicine

## 2017-08-07 ENCOUNTER — Other Ambulatory Visit: Payer: Self-pay

## 2017-08-07 DIAGNOSIS — I10 Essential (primary) hypertension: Secondary | ICD-10-CM | POA: Diagnosis not present

## 2017-08-07 DIAGNOSIS — E119 Type 2 diabetes mellitus without complications: Secondary | ICD-10-CM | POA: Diagnosis not present

## 2017-08-07 DIAGNOSIS — Z7984 Long term (current) use of oral hypoglycemic drugs: Secondary | ICD-10-CM | POA: Insufficient documentation

## 2017-08-07 DIAGNOSIS — Z79899 Other long term (current) drug therapy: Secondary | ICD-10-CM | POA: Insufficient documentation

## 2017-08-07 DIAGNOSIS — F17210 Nicotine dependence, cigarettes, uncomplicated: Secondary | ICD-10-CM | POA: Diagnosis not present

## 2017-08-07 DIAGNOSIS — I469 Cardiac arrest, cause unspecified: Secondary | ICD-10-CM | POA: Diagnosis not present

## 2017-08-07 DIAGNOSIS — R404 Transient alteration of awareness: Secondary | ICD-10-CM | POA: Diagnosis present

## 2017-08-07 MED ORDER — EPINEPHRINE PF 1 MG/10ML IJ SOSY
PREFILLED_SYRINGE | INTRAMUSCULAR | Status: AC | PRN
  Administered 2017-08-07: 1 mg via INTRAVENOUS

## 2017-08-07 MED ORDER — EPINEPHRINE PF 1 MG/10ML IJ SOSY
PREFILLED_SYRINGE | INTRAMUSCULAR | Status: AC | PRN
  Administered 2017-08-07 (×2): 1 mg via INTRAVENOUS

## 2017-08-07 MED ORDER — SODIUM BICARBONATE 8.4 % IV SOLN
INTRAVENOUS | Status: AC | PRN
  Administered 2017-08-07: 50 meq via INTRAVENOUS

## 2017-08-07 MED FILL — Medication: Qty: 1 | Status: AC

## 2017-08-10 LAB — GLUCOSE, CAPILLARY: GLUCOSE-CAPILLARY: 61 mg/dL — AB (ref 70–99)

## 2017-08-27 NOTE — ED Triage Notes (Signed)
[facility] with CPR in progress. Per EMS, pt was found unresponsive on the floor of her home. Family states she was "talking out of her mind" last night. Per EMS, pt was minimally responsive to them, saying "hi" and nodding her head to some questions. Pt became bradycardic and was paced en route. CPR begun by EMS PTA.   Dr. Cyril LoosenKinner present on patient arrival. Pt placed on [place] immediately upon arrival 0826.

## 2017-08-27 NOTE — ED Provider Notes (Signed)
[facility] Emergency Department Provider Note   ____________________________________________    I have reviewed the triage vital signs and the nursing notes.   HISTORY  Chief Complaint CPR     HPI Sarah Wiley is a 67 y.o. female who presents with CPR in progress.  History as per EMS, they report when they arrived the patient had been found down this morning, had been "acting strangely "through the night.  Had defecated on herself and vomited.  During transit the patient became unresponsive and lost pulses, ACLS started  Past Medical History:  Diagnosis Date  . Arthritis   . Diabetes mellitus without complication (HCC)   . Dyspnea    DOE  . Elevated lipids   . Heart murmur   . Hypertension   . Neuropathy     Patient Active Problem List   Diagnosis Date Noted  . HTN (hypertension) 04/07/2017  . Hypercholesterolemia 04/07/2017    Past Surgical History:  Procedure Laterality Date  . BREAST SURGERY     LUMPECTOMY  . CATARACT EXTRACTION W/PHACO Right 02/17/2017   Procedure: CATARACT EXTRACTION PHACO AND INTRAOCULAR LENS PLACEMENT (IOC);  Surgeon: Galen Manila, MD;  Location: [facility] ORS;  Service: Ophthalmology;  Laterality: Right;  [place] 00:32.0 AP% 15.1 CDE 4.82 Fluid Pack Lot # [id number]  . CATARACT EXTRACTION W/PHACO Left 03/17/2017   Procedure: CATARACT EXTRACTION PHACO AND INTRAOCULAR LENS PLACEMENT (IOC);  Surgeon: Galen Manila, MD;  Location: [facility] ORS;  Service: Ophthalmology;  Laterality: Left;  [place] 00:39.2 AP% 13.5 CDE 5.29 Fluid Pack Lot # [id number]   . CESAREAN SECTION      Prior to Admission medications   Medication Sig Start Date End Date Taking? Authorizing Provider  acetaminophen (TYLENOL) 500 MG tablet Take 1,000 mg by mouth every 6 (six) hours as needed for moderate pain or headache.    [provider]  atorvastatin (LIPITOR) 20 MG tablet Take 20 mg by mouth daily at 6 PM.    [provider]    gabapentin (NEURONTIN) 300 MG capsule Take 900 mg by mouth 2 (two) times daily.     [provider]  metFORMIN (GLUCOPHAGE) 500 MG tablet Take 500 mg by mouth 2 (two) times daily with a meal.     [provider]  Metoprolol Succinate 25 MG CS24 Take 25 mg by mouth 2 (two) times daily.    [provider]  quinapril (ACCUPRIL) 40 MG tablet Take 80 mg by mouth at bedtime.     [provider]     Allergies Patient has no known allergies.  Family History  Problem Relation Age of Onset  . Pancreatic cancer Father   . Pancreatic cancer Brother     Social History Social History   Tobacco Use  . Smoking status: Current Some Day Smoker    Types: Cigarettes  . Smokeless tobacco: Current User    Types: Snuff  Substance Use Topics  . Alcohol use: No  . Drug use: No    Level 5 caveat: Unable to obtain review of Systems due to ACLS in progress     ____________________________________________   PHYSICAL EXAM:  VITAL SIGNS: ED Triage Vitals [08-27-17 0845]  Enc Vitals Group     BP      Pulse      Resp      Temp      Temp src      SpO2      Weight 113.4 kg (250 lb)  Height 1.626 m (5\' 4" )     Head Circumference      Peak Flow      Pain Score      Pain Loc      Pain Edu?      Excl. in GC?     Constitutional: Unresponsive  Head: Atraumatic.  Mouth/Throat: Vomitus in the mouth, dark red  Cardiovascular: No cardiac activity Respiratory: Patient being bagged with BVM Gastrointestinal: Mild distention  Musculoskeletal: No evidence of trauma, extremities are cool Neurologic: Unresponsive Skin: No evidence of trauma or abrasion   ____________________________________________   LABS (all labs ordered are listed, but only abnormal results are displayed)  Labs Reviewed - No data to  display ____________________________________________  EKG  None ____________________________________________  RADIOLOGY  None ____________________________________________   PROCEDURES  Procedure(s) performed: yes  Procedure Name: Intubation Date/Time: 2017/07/16 9:28 AM Performed by: Jene EveryKinner, Jeshua Ransford, MD Pre-anesthesia Checklist: Suction available and Patient being monitored Oxygen Delivery Method: Ambu bag Laryngoscope Size: Glidescope and 3 Tube size: 7.5 mm Number of attempts: 1 Placement Confirmation: ETT inserted through vocal cords under direct vision,  CO2 detector and Breath sounds checked- equal and bilateral Dental Injury: Teeth and Oropharynx as per pre-operative assessment         Critical Care performed: yes  CRITICAL CARE Performed by: Jene Everyobert Dewitte Vannice   Total critical care time: 25 minutes  Critical care time was exclusive of separately billable procedures and treating other patients.  Critical care was necessary to treat or prevent imminent or life-threatening deterioration.  Critical care was time spent personally by me on the following activities: development of treatment plan with patient and/or surrogate as well as nursing, discussions with consultants, evaluation of patient's response to treatment, examination of patient, obtaining history from patient or surrogate, ordering and performing treatments and interventions, ordering and review of laboratory studies, ordering and review of radiographic studies, pulse oximetry and re-evaluation of patient's condition.  ____________________________________________   INITIAL IMPRESSION / ASSESSMENT AND PLAN / ED COURSE  Pertinent labs & imaging results that were available during my care of the patient were reviewed by me and considered in my medical decision making (see chart for details).  Patient presents with ACLS in progress, apparently became unresponsive about 5 minutes prior to arrival.  ACLS  continued in the emergency department, 5 rounds of epinephrine, 2 rounds of bicarb, and rhythm was asystole with a brief period of PEA.  Intubated by me, bloody vomitus noted  Family informed, both daughters    ____________________________________________   FINAL CLINICAL IMPRESSION(S) / ED DIAGNOSES  Final diagnoses:  Cardiac arrest [facility](HCC)        Note:  This document was prepared using Dragon voice recognition software and may include unintentional dictation errors.    Jene EveryKinner, Naidelin Gugliotta, MD November 23, 2017 0930

## 2017-08-27 NOTE — ED Notes (Signed)
Unable to answer many triage questions d/t patient unresponsiveness/acuity and no family present.

## 2017-08-27 NOTE — Progress Notes (Signed)
   2017/04/27 0915  Clinical Encounter Type  Visited With Family  Visit Type ED;Death   Paged for patient death, staff requests chaplain assistance for 5 family members present.  Family reports patient had a sudden change of status while en route to hospital via EMS and died shortly thereafter.  Chaplain engaged family in conversation and provided a calming presence.  Family requested to speak to the doctor again, and Chaplain located the doctor and asked him to come answer family's questions about what happened.  Chaplain escorted family to treatment room to say goodbye to their loved one and said a prayer for the deceased with 2 relatives. Upon their request, Chaplain then escorted the family to the ED exit so they could leave.

## 2017-08-27 DEATH — deceased

## 2020-05-15 ENCOUNTER — Encounter: Payer: Self-pay | Admitting: Obstetrics and Gynecology
